# Patient Record
Sex: Female | Born: 1950 | Race: White | Hispanic: No | Smoking: Former smoker
Health system: Southern US, Community
[De-identification: ages and names within clinical notes are randomized; demographics above are authoritative.]

## PROBLEM LIST (undated history)

## (undated) DIAGNOSIS — R079 Chest pain, unspecified: Secondary | ICD-10-CM

## (undated) DIAGNOSIS — K2270 Barrett's esophagus without dysplasia: Secondary | ICD-10-CM

## (undated) DIAGNOSIS — K219 Gastro-esophageal reflux disease without esophagitis: Secondary | ICD-10-CM

## (undated) DIAGNOSIS — F640 Transsexualism: Secondary | ICD-10-CM

## (undated) HISTORY — PX: CARDIAC CATHETERIZATION: SHX172

## (undated) HISTORY — PX: COLONOSCOPY: SHX174

## (undated) HISTORY — DX: Transsexualism: F64.0

## (undated) HISTORY — PX: ORCHIECTOMY: SHX2116

## (undated) HISTORY — DX: Chest pain, unspecified: R07.9

## (undated) HISTORY — PX: ESOPHAGOGASTRODUODENOSCOPY: SHX1529

---

## 2007-12-27 ENCOUNTER — Emergency Department: Payer: Self-pay | Admitting: Emergency Medicine

## 2008-10-27 ENCOUNTER — Ambulatory Visit: Payer: Self-pay | Admitting: Cardiovascular Disease

## 2008-10-29 ENCOUNTER — Ambulatory Visit: Payer: Self-pay | Admitting: Cardiology

## 2008-10-29 ENCOUNTER — Ambulatory Visit: Payer: Self-pay | Admitting: Cardiovascular Disease

## 2008-11-09 ENCOUNTER — Ambulatory Visit: Payer: Self-pay | Admitting: Internal Medicine

## 2008-11-09 ENCOUNTER — Encounter: Payer: Self-pay | Admitting: Cardiovascular Disease

## 2008-11-09 LAB — CONVERTED CEMR LAB
CO2: 20 meq/L (ref 19–32)
Chloride: 102 meq/L (ref 96–112)
Creatinine, Ser: 0.7 mg/dL (ref 0.40–1.20)
Hemoglobin: 13.1 g/dL (ref 12.0–15.0)
INR: 1 (ref 0.0–1.5)
MCHC: 32.8 g/dL (ref 30.0–36.0)
RBC: 4.09 M/uL (ref 3.87–5.11)

## 2008-11-12 ENCOUNTER — Ambulatory Visit: Payer: Self-pay | Admitting: Cardiovascular Disease

## 2008-11-12 ENCOUNTER — Inpatient Hospital Stay (HOSPITAL_BASED_OUTPATIENT_CLINIC_OR_DEPARTMENT_OTHER): Admission: RE | Admit: 2008-11-12 | Discharge: 2008-11-12 | Payer: Self-pay | Admitting: Cardiovascular Disease

## 2009-04-01 DIAGNOSIS — R079 Chest pain, unspecified: Secondary | ICD-10-CM

## 2009-08-24 ENCOUNTER — Ambulatory Visit: Payer: Self-pay | Admitting: Gastroenterology

## 2010-11-21 ENCOUNTER — Ambulatory Visit: Payer: Self-pay | Admitting: Gastroenterology

## 2011-03-07 NOTE — Cardiovascular Report (Signed)
NAMEAUTUMN, [name]                 ACCOUNT NO.:  [id number]   MEDICAL RECORD NO.:  [id number]          PATIENT TYPE:  OIB   LOCATION:  1963                         FACILITY:  [facility]   PHYSICIAN:  Veverly Fells. Excell Seltzer, MD  DATE OF BIRTH:  1951/05/04   DATE OF PROCEDURE:  11/12/2008  DATE OF DISCHARGE:  11/12/2008                            CARDIAC CATHETERIZATION   PROCEDURE:  Left heart catheterization, selective coronary angiography,  and left ventricular angiography.   INDICATIONS:  [name] is a 60 year old woman with exertional chest  pain and highly suggestive symptoms of angina.  She underwent an  exercise Myoview stress scan with normal perfusion imaging.  However,  she had chest pain with exercise as well as significant ST-segment  depression.  In the setting of suggestive symptoms, she was referred for  cardiac catheterization.   Risks and indications of the procedure were reviewed with the patient.  Informed consent was obtained.  A right radial approach was used for  access.  The right wrist was prepped and draped under normal sterile  conditions.  Lidocaine 1% was used for local anesthetic.  Using a  micropuncture kit, the right radial artery was accessed with a front  wall puncture and then a 5-French sheath was placed in the radial  artery.  Verapamil, nitroglycerin, and heparin were infused into the  radial artery at the time of access.  The 5-French catheters were used  for coronary angiography and left ventriculography.  All catheter  exchanges were performed over a guidewire.  Intra-arterial nitroglycerin  was given for catheter exchanges to prevent vasospasm.  The patient  tolerated the procedure well, and there were no immediate complications.   FINDINGS:  Aortic pressure 109/62 with a mean of 81.  Left ventricular  pressure 109/16.   Left ventricular angiography demonstrates normal LV function with no  wall motion abnormalities and an LVEF of 60%.   CORONARY  ANGIOGRAPHY:  Left mainstem:  The left main is widely patent.  It bifurcates into the LAD and left circumflex.   LAD:  The LAD is a large-caliber vessel that courses down and reaches  the LV apex.  There is minor diffuse plaque throughout the proximal  vessel creating 20-30% stenosis, most significantly at the origin of the  first diagonal branch.  The first diagonal branch is moderate-sized  vessel with a 50% ostial stenosis.  The remaining portions of the mid  LAD have no significant obstructive disease.  There is a mild bridging  segment in the midportion of the LAD.   Left circumflex:  The left circumflex has minor ostial stenosis in the  range of 30%.  It supplies a large first OM branch and has no  significant stenosis.  The AV groove circumflex courses down and has  minor nonobstructive plaque at the bifurcation just beyond the first OM.   Right coronary artery.  The right coronary artery is dominant.  It is  smooth throughout its course with no significant luminal irregularity or  obstructive disease.  It supplies a small PDA branch and a large right  posterolateral branch.  FINAL CONCLUSIONS:  1. Mild nonobstructive coronary artery disease.  2. Normal left ventricular systolic function with an left ventricular      ejection fraction of 60%.   PLAN:  Reassurance.  Suspect noncardiac chest pain.  Medical therapy for  nonobstructive CAD.      Veverly Fells. Excell Seltzer, MD  Electronically Signed     MDC/MEDQ  D:  11/12/2008  T:  11/13/2008  Job:  [id number]

## 2011-03-07 NOTE — Assessment & Plan Note (Signed)
[facility] OFFICE NOTE   Kathleen, Donaldson                          MRN:          [id number]  DATE:10/27/2008                            DOB:          01-27-1951    REASON FOR VISIT:  Chest pain.   HISTORY OF PRESENT ILLNESS:  [name] is a 60 year old woman who  presents for evaluation of chest pain.  The patient describes episodes  of tightness in the left chest when lying on the left side.  This occurs  at rest and has been going on for several months.  However, she also  describes exertional chest discomfort that occurred while shoveling snow  in the past few weeks.  The pain felt sharp and was located in the  center of the chest.  It was nonradiating.  Her symptoms resolve with  rest.  There was no associated dyspnea, diaphoresis, nausea, or  vomiting.  She recalls having similar symptoms with vigorous work such  as snow shoveling in the past, but has never presented for evaluation.  She has no history of cardiac problems.  She also complains of shortness  of breath with moderate level exertion and ankle swelling.  The patient  denies dizziness, lightheadedness, palpitations, or syncope.   PAST MEDICAL HISTORY:  1. The patient is transsexual.  She has been taking Premarin and      spironolactone and is planning on having surgery at some point in      the future.  2. No history of hypertension, diabetes, or dyslipidemia.   PAST SURGICAL HISTORY:  Bone grafting of the left hip to the navicular  bone of the left wrist in 1978.   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  She has had a lot of stress at  work recently.  She is divorced, with 1 child, and retired from [employer].  Positive smoker with heavy smoking history of 76-pack years,  quit smoking in February 2008.  Drinks 2 alcoholic beverages daily.  No  routine exercise.   FAMILY HISTORY:  Mother died at age 74 of pneumonia.  Father died at age  74 of myocardial infarction.  Four siblings are alive and well.   MEDICATIONS:  1. Spironolactone 25 mg 2 twice daily.  2. Premarin 1.25 mg 4 daily.  3. Aspirin 81 mg daily.  4. Multivitamin 1 daily.  5. Omega-3 1000 mg daily.  6. Calcium every other day.   ALLERGIES:  NKDA.   REVIEW OF SYSTEMS:  Complete 12-point review of systems was performed.  Pertinent positives included osteoarthritis, anxiety, and depression.  Otherwise, per HPI.  All other systems were reviewed and are negative  except as outlined.   PHYSICAL EXAMINATION:  GENERAL:  The patient is alert and oriented, in  no acute distress.  VITAL SIGNS:  Weight is 198 pounds, blood pressure 140/78, heart rate  65, and respiratory rate is 12.  HEENT:  Normal.  NECK:  Normal carotid upstrokes, no bruits.  JVP normal.  No thyromegaly  or thyroid nodules.  LUNGS:  Clear bilaterally.  HEART:  Regular rate and rhythm.  No murmurs or gallops.  ABDOMEN:  Soft, nontender, no organomegaly.  BACK:  No CVA or tenderness.  EXTREMITIES:  No clubbing or cyanosis.  There is 1+ pretibial edema  bilaterally.  Pedal pulses are 2+ and equal.  SKIN:  Warm and dry without rash.   EKG shows normal sinus rhythm and is within normal limits.   ASSESSMENT:  This is a 60 year old woman with exertional chest pain.  There is a paucity of cardiovascular risk factors.  However, the  patient's symptoms are concerning as they are exertional in nature.  She  has both typical and atypical features of chest pain.   PLAN:  An exercise Myoview stress scan to rule out significant ischemia.  If her stress study is normal or at low risk, I would not pursue  invasive workup.  Further plans pending the result of the patient's  stress test.     [name]. Excell Seltzer, MD  Electronically Signed    MDC/MedQ  DD: 10/27/2008  DT: 10/28/2008  Job #: [id number]   cc:   Zachery Dakins, MD

## 2011-04-10 ENCOUNTER — Encounter: Payer: Self-pay | Admitting: Cardiovascular Disease

## 2012-12-24 ENCOUNTER — Ambulatory Visit: Payer: Self-pay | Admitting: Gastroenterology

## 2012-12-25 LAB — PATHOLOGY REPORT

## 2015-02-09 ENCOUNTER — Ambulatory Visit
Admit: 2015-02-09 | Disposition: A | Discharge status: Home / Self Care | Payer: Self-pay | Attending: Gastroenterology | Admitting: Gastroenterology

## 2015-02-15 LAB — SURGICAL PATHOLOGY

## 2017-09-26 ENCOUNTER — Ambulatory Visit (INDEPENDENT_AMBULATORY_CARE_PROVIDER_SITE_OTHER): Payer: Medicare Other | Admitting: Podiatry

## 2017-09-26 ENCOUNTER — Encounter: Payer: Self-pay | Admitting: Podiatry

## 2017-09-26 VITALS — BP 143/73 | HR 53 | Temp 97.2°F | Resp 18

## 2017-09-26 DIAGNOSIS — L603 Nail dystrophy: Secondary | ICD-10-CM

## 2017-09-26 MED ORDER — NEOMYCIN-POLYMYXIN-HC 3.5-10000-1 OT SOLN: OTIC | 0 refills | Status: DC

## 2017-09-26 NOTE — Progress Notes (Signed)
  Subjective:  Patient ID: [name], female    DOB: 1951-02-26,  MRN: [id number] HPI Chief Complaint  Patient presents with  . Nail Problem    Patient presents today for nail fungus bilat great toenails and 5th toenails x years.  She states they only hurt when they grow thick.  she has used OTC fungal liquids with slight improvement.    66 y.o. female presents with the above complaint.     Past Medical History:  Diagnosis Date  . Chest pain, unspecified   . Transexualism      Current Outpatient Medications:  .  estradiol (VIVELLE-DOT) 0.075 MG/24HR, Place onto the skin., Disp: , Rfl:  .  omeprazole (PRILOSEC) 40 MG capsule, Take by mouth., Disp: , Rfl:  .  acetaminophen (TYLENOL) 500 MG tablet, Take by mouth., Disp: , Rfl:  .  Melatonin 5 MG TABS, Take by mouth., Disp: , Rfl:  .  Multiple Vitamins-Minerals (MULTIVITAMIN WOMEN) TABS, Take by mouth., Disp: , Rfl:   No Known Allergies Review of Systems  All other systems reviewed and are negative.  Objective:   Vitals:   09/26/17 0925  BP: (!) 143/73  Pulse: (!) 53  Resp: 18  Temp: (!) 97.2 F (36.2 C)    General: Well developed, nourished, in no acute distress, alert and oriented x3   Dermatological: Skin is warm, dry and supple bilateral. Nails x 10 are well maintained; remaining integument appears unremarkable at this time. There are no open sores, no preulcerative lesions, no rash or signs of infection present.  There appears to be no signs of fungus on the skin.  Lesser nails particularly #2 #3 #4 the bilateral foot demonstrates relatively normal nail structure.  The hallux nails bilaterally do demonstrate what appears to be a nail dystrophy with thickening of the nails subungual debris.  Fifth toes similarly.  No signs of bacterial infection.  Vascular: Dorsalis Pedis artery and Posterior Tibial artery pedal pulses are 2/4 bilateral with immedate capillary fill time. Pedal hair growth present. No varicosities and no  lower extremity edema present bilateral.   Neruologic: Grossly intact via light touch bilateral. Vibratory intact via tuning fork bilateral. Protective threshold with Semmes Wienstein monofilament intact to all pedal sites bilateral. Patellar and Achilles deep tendon reflexes 2+ bilateral. No Babinski or clonus noted bilateral.   Musculoskeletal: No gross boney pedal deformities bilateral. No pain, crepitus, or limitation noted with foot and ankle range of motion bilateral. Muscular strength 5/5 in all groups tested bilateral.  Gait: Unassisted, Nonantalgic.    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  More than likely this will be nail dystrophy rather than any type of fungus or yeast or mold.     Launa Goedken T. RyeHyatt, [place]

## 2017-10-29 ENCOUNTER — Encounter: Payer: Self-pay | Admitting: Podiatry

## 2017-10-29 ENCOUNTER — Ambulatory Visit (INDEPENDENT_AMBULATORY_CARE_PROVIDER_SITE_OTHER): Payer: Medicare Other | Admitting: Podiatry

## 2017-10-29 DIAGNOSIS — L603 Nail dystrophy: Secondary | ICD-10-CM

## 2017-10-29 DIAGNOSIS — Z79899 Other long term (current) drug therapy: Secondary | ICD-10-CM | POA: Diagnosis not present

## 2017-10-29 MED ORDER — TERBINAFINE HCL 250 MG PO TABS: 250.0000 mg | ORAL_TABLET | Freq: Every day | ORAL | 0 refills | Status: DC

## 2017-10-29 NOTE — Progress Notes (Signed)
She presents today for follow-up of her pathology results regarding her toenail.  Objective: Vital signs are stable alert and oriented x3.  This point there is no change in physical exam only a positive onychomycosis from the lab.  Assessment: Onychomycosis.  Plan: After thorough discussion today we have decided to utilize oral Lamisil therapy.  We did discuss the pros and cons of this oral therapy today.  Wrote her prescription for 250 mg tablets 1 tablet by mouth daily.  Total of 30 tablets were dispensed with no refills.  Also wrote a prescription for liver profile requisition to be performed immediately.  I will follow-up with her in 1 month.  We did discuss the possible side effects.

## 2017-10-29 NOTE — Patient Instructions (Signed)

## 2017-10-30 ENCOUNTER — Telehealth: Payer: Self-pay | Admitting: *Deleted

## 2017-10-30 LAB — HEPATIC FUNCTION PANEL
ALK PHOS: 93 IU/L (ref 39–117)
ALT: 12 IU/L (ref 0–32)
AST: 16 IU/L (ref 0–40)
Albumin: 4.4 g/dL (ref 3.6–4.8)
BILIRUBIN, DIRECT: 0.09 mg/dL (ref 0.00–0.40)
Bilirubin Total: 0.3 mg/dL (ref 0.0–1.2)
TOTAL PROTEIN: 7.3 g/dL (ref 6.0–8.5)

## 2017-10-30 NOTE — Telephone Encounter (Signed)
-----   Message from Elinor ParkinsonMax T Hyatt, [place] sent at 10/30/2017  8:04 AM EST ----- Blood work looks good and may continue medication.

## 2017-10-30 NOTE — Telephone Encounter (Signed)
Left message informing pt of Dr. Hyatt's review of results and orders. 

## 2017-11-16 ENCOUNTER — Encounter: Payer: Self-pay | Admitting: *Deleted

## 2017-11-19 ENCOUNTER — Encounter: Payer: Self-pay | Admitting: *Deleted

## 2017-11-19 ENCOUNTER — Encounter
Admission: RE | Disposition: A | Discharge status: Home / Self Care | Payer: Self-pay | Source: Ambulatory Visit | Attending: Gastroenterology

## 2017-11-19 ENCOUNTER — Ambulatory Visit: Payer: Medicare Other | Admitting: Anesthesiology

## 2017-11-19 ENCOUNTER — Ambulatory Visit
Admission: RE | Admit: 2017-11-19 | Discharge: 2017-11-19 | Disposition: A | Discharge status: Home / Self Care | Payer: Medicare Other | Source: Ambulatory Visit | Attending: Gastroenterology | Admitting: Gastroenterology

## 2017-11-19 DIAGNOSIS — F640 Transsexualism: Secondary | ICD-10-CM | POA: Insufficient documentation

## 2017-11-19 DIAGNOSIS — K2270 Barrett's esophagus without dysplasia: Secondary | ICD-10-CM | POA: Insufficient documentation

## 2017-11-19 DIAGNOSIS — K449 Diaphragmatic hernia without obstruction or gangrene: Secondary | ICD-10-CM | POA: Insufficient documentation

## 2017-11-19 DIAGNOSIS — Z79899 Other long term (current) drug therapy: Secondary | ICD-10-CM | POA: Insufficient documentation

## 2017-11-19 DIAGNOSIS — K210 Gastro-esophageal reflux disease with esophagitis: Secondary | ICD-10-CM | POA: Diagnosis not present

## 2017-11-19 DIAGNOSIS — K2950 Unspecified chronic gastritis without bleeding: Secondary | ICD-10-CM | POA: Insufficient documentation

## 2017-11-19 DIAGNOSIS — Z87891 Personal history of nicotine dependence: Secondary | ICD-10-CM | POA: Diagnosis not present

## 2017-11-19 HISTORY — DX: Barrett's esophagus without dysplasia: K22.70

## 2017-11-19 HISTORY — PX: ESOPHAGOGASTRODUODENOSCOPY (EGD) WITH PROPOFOL: SHX5813

## 2017-11-19 HISTORY — DX: Gastro-esophageal reflux disease without esophagitis: K21.9

## 2017-11-19 SURGERY — ESOPHAGOGASTRODUODENOSCOPY (EGD) WITH PROPOFOL
Anesthesia: General

## 2017-11-19 MED ORDER — SODIUM CHLORIDE 0.9 % IV SOLN
INTRAVENOUS | Status: DC
  Administered 2017-11-19: 13:00:00 via INTRAVENOUS

## 2017-11-19 MED ORDER — MIDAZOLAM HCL 2 MG/2ML IJ SOLN
INTRAMUSCULAR | Status: DC | PRN
  Administered 2017-11-19: 1 mg via INTRAVENOUS

## 2017-11-19 MED ORDER — MIDAZOLAM HCL 2 MG/2ML IJ SOLN
INTRAMUSCULAR | Status: AC
  Filled 2017-11-19: qty 2

## 2017-11-19 MED ORDER — PROPOFOL 500 MG/50ML IV EMUL
INTRAVENOUS | Status: DC | PRN
  Administered 2017-11-19: 140 ug/kg/min via INTRAVENOUS

## 2017-11-19 MED ORDER — SODIUM CHLORIDE 0.9 % IV SOLN: INTRAVENOUS | Status: DC

## 2017-11-19 MED ORDER — PROPOFOL 10 MG/ML IV BOLUS
INTRAVENOUS | Status: DC | PRN
  Administered 2017-11-19: 50 mg via INTRAVENOUS

## 2017-11-19 MED ORDER — GLYCOPYRROLATE 0.2 MG/ML IJ SOLN
INTRAMUSCULAR | Status: DC | PRN
  Administered 2017-11-19: 0.2 mg via INTRAVENOUS

## 2017-11-19 MED ORDER — FENTANYL CITRATE (PF) 100 MCG/2ML IJ SOLN
INTRAMUSCULAR | Status: AC
  Filled 2017-11-19: qty 2

## 2017-11-19 MED ORDER — PROPOFOL 500 MG/50ML IV EMUL
INTRAVENOUS | Status: AC
  Filled 2017-11-19: qty 50

## 2017-11-19 MED ORDER — FENTANYL CITRATE (PF) 100 MCG/2ML IJ SOLN
INTRAMUSCULAR | Status: DC | PRN
  Administered 2017-11-19: 50 ug via INTRAVENOUS

## 2017-11-19 NOTE — Op Note (Signed)
[facility] Gastroenterology Patient Name: Kathleen QuailLisa Donaldson Procedure Date: 11/19/2017 2:33 PM MRN: [id number] Account #: [id number] Date of Birth: 01-Mar-1951 Admit Type: Outpatient Age: [age] [facility] Gender: Female Note Status: Finalized Procedure:            Upper GI endoscopy Indications:          Follow-up of Barrett's esophagus Providers:            Christena DeemMartin U. Roque Schill, MD Referring MD:         Rhona LeavensJames F. Burnett ShengHedrick, MD (Referring MD) Medicines:            Monitored Anesthesia Care Complications:        No immediate complications. Procedure:            Pre-Anesthesia Assessment:                       - ASA Grade Assessment: III - A patient with severe                        systemic disease.                       After obtaining informed consent, the endoscope was                        passed under direct vision. Throughout the procedure,                        the patient's blood pressure, pulse, and oxygen                        saturations were monitored continuously. The Endoscope                        was introduced through the mouth, and advanced to the                        third part of duodenum. The upper GI endoscopy was                        accomplished without difficulty. The patient tolerated                        the procedure well. Findings:      There were esophageal mucosal changes secondary to established       short-segment Barrett's disease present in the distal esophagus. The       maximum longitudinal extent of these mucosal changes was 2 cm in length.       Mucosa was biopsied with a cold forceps for histology in 4 quadrants at       intervals of 2 cm at 39, 40 and 42 cm from the incisors. A total of 3       specimen bottles were sent to pathology.      A small hiatal hernia was found. The Z-line was a variable distance from       incisors; the hiatal hernia was sliding.      Diffuse mild inflammation characterized by  congestion (edema) and       erythema was found in the gastric body. Biopsies were taken with a cold  forceps for histology. Biopsies were taken with a cold forceps for       Helicobacter pylori testing.      The examined duodenum was normal. Impression:           - Esophageal mucosal changes secondary to established                        short-segment Barrett's disease. Biopsied.                       - Small hiatal hernia.                       - gastritis Recommendation:       - Await pathology results.                       - Continue present medications.                       - Telephone GI clinic for pathology results in 1 week. Procedure Code(s):    --- Professional ---                       [phone], Esophagogastroduodenoscopy, flexible, transoral;                        with biopsy, single or multiple Diagnosis Code(s):    --- Professional ---                       K22.70, Barrett's esophagus without dysplasia                       K44.9, Diaphragmatic hernia without obstruction or                        gangrene CPT copyright 2016 American Medical Association. All rights reserved. The codes documented in this report are preliminary and upon coder review may  be revised to meet current compliance requirements. Christena Deem, MD 11/19/2017 3:00:31 PM This report has been signed electronically. Number of Addenda: 0 Note Initiated On: 11/19/2017 2:33 PM      [facility]

## 2017-11-19 NOTE — Anesthesia Procedure Notes (Signed)
Date/Time: 11/19/2017 2:35 PM Performed by: Henrietta HooverPope, Noreen Mackintosh, CRNA

## 2017-11-19 NOTE — H&P (Signed)
Outpatient short stay form Pre-procedure 11/19/2017 2:26 PM [name] [name] [name]  Primary Physician: Dr. Jerl MinaJames Donaldson  Reason for visit:  EGD  History of present illness:  Patient is a 67 year old female presenting today as above. She has personal history of irritable esophagus and is presenting today for follow-up. She has no problem with abdominal pain heartburn or difficulty swallowing. She takes an 81 mg aspirin that has been held. She takes no other aspirin or blood thinning agent.    Current Facility-Administered Medications:  .  0.9 %  sodium chloride infusion, , Intravenous, Continuous, [name], [name], [name], Last Rate: 20 mL/hr at 11/19/17 1307 .  0.9 %  sodium chloride infusion, , Intravenous, Continuous, [name], [name], [name]  Medications Prior to Admission  Medication Sig Dispense Refill Last Dose  . acetaminophen (TYLENOL) 500 MG tablet Take by mouth.   11/18/2017 at Unknown time  . estradiol (VIVELLE-DOT) 0.075 MG/24HR Place onto the skin.   11/19/2017 at Unknown time  . Melatonin 5 MG TABS Take by mouth.   Past Week at Unknown time  . Multiple Vitamins-Minerals (MULTIVITAMIN WOMEN) TABS Take by mouth.   Past Week at Unknown time  . omeprazole (PRILOSEC) 40 MG capsule Take by mouth.   11/18/2017 at Unknown time  . terbinafine (LAMISIL) 250 MG tablet Take 1 tablet (250 mg total) by mouth daily. 30 tablet 0 Past Week at Unknown time     No Known Allergies   Past Medical History:  Diagnosis Date  . Barrett's esophagus   . Chest pain, unspecified   . GERD (gastroesophageal reflux disease)   . Transexualism     Review of systems:      Physical Exam    Heart and lungs: Regular rate and rhythm without rub or gallop, lungs are bilaterally clear.    HEENT: Normocephalic atraumatic eyes are anicteric    Other:     Pertinant exam for procedure: Soft nontender nondistended bowel sounds positive normoactive.    Planned proceedures: EGD and indicated procedures. I  have discussed the risks benefits and complications of procedures to include not limited to bleeding, infection, perforation and the risk of sedation and the patient wishes to proceed.    [name], [name] [facility] 11/19/2017  2:26 PM

## 2017-11-19 NOTE — Transfer of Care (Signed)
Immediate Anesthesia Transfer of Care Note  Patient: [name]  Procedure(s) Performed: ESOPHAGOGASTRODUODENOSCOPY (EGD) WITH PROPOFOL (N/A )  Patient Location: PACU  Anesthesia Type:General  Level of Consciousness: sedated  Airway & Oxygen Therapy: Patient Spontanous Breathing and Patient connected to nasal cannula oxygen  Post-op Assessment: Report given to RN and Post -op Vital signs reviewed and stable  Post vital signs: Reviewed and stable  Last Vitals:  Vitals:   11/19/17 1250  BP: (!) 130/58  Pulse: 63  Resp: 16  Temp: (!) 35.8 C  SpO2: 100%    Last Pain:  Vitals:   11/19/17 1250  TempSrc: Tympanic         Complications: No apparent anesthesia complications

## 2017-11-19 NOTE — Anesthesia Postprocedure Evaluation (Signed)
Anesthesia Post Note  Patient: [name]  Procedure(s) Performed: ESOPHAGOGASTRODUODENOSCOPY (EGD) WITH PROPOFOL (N/A )  Patient location during evaluation: PACU Anesthesia Type: General Level of consciousness: awake and alert and oriented Pain management: pain level controlled Vital Signs Assessment: post-procedure vital signs reviewed and stable Respiratory status: spontaneous breathing Cardiovascular status: blood pressure returned to baseline Anesthetic complications: no     Last Vitals:  Vitals:   11/19/17 1500 11/19/17 1510  BP: 109/67 106/71  Pulse: 62 (!) 54  Resp: 16 18  Temp: (!) 35.6 C   SpO2: 94% 99%    Last Pain:  Vitals:   11/19/17 1500  TempSrc: Tympanic                 Sylvan Sookdeo

## 2017-11-19 NOTE — Anesthesia Preprocedure Evaluation (Addendum)
Anesthesia Evaluation  Patient identified by MRN, date of birth, ID band Patient awake    Reviewed: Allergy & Precautions, NPO status , Patient's Chart, lab work & pertinent test results, reviewed documented beta blocker date and time   Airway Mallampati: II  TM Distance: >3 FB     Dental  (+) Chipped, Upper Dentures, Lower Dentures   Pulmonary former smoker,           Cardiovascular      Neuro/Psych    GI/Hepatic GERD  ,  Endo/Other    Renal/GU      Musculoskeletal   Abdominal   Peds  Hematology   Anesthesia Other Findings   Reproductive/Obstetrics                            Anesthesia Physical Anesthesia Plan  ASA: III  Anesthesia Plan: General   Post-op Pain Management:    Induction: Intravenous  PONV Risk Score and Plan:   Airway Management Planned:   Additional Equipment:   Intra-op Plan:   Post-operative Plan:   Informed Consent: I have reviewed the patients History and Physical, chart, labs and discussed the procedure including the risks, benefits and alternatives for the proposed anesthesia with the patient or authorized representative who has indicated his/her understanding and acceptance.     Plan Discussed with: CRNA  Anesthesia Plan Comments:         Anesthesia Quick Evaluation

## 2017-11-19 NOTE — Anesthesia Post-op Follow-up Note (Signed)
Anesthesia QCDR form completed.        

## 2017-11-20 ENCOUNTER — Encounter: Payer: Self-pay | Admitting: Gastroenterology

## 2017-11-22 LAB — SURGICAL PATHOLOGY

## 2017-11-26 ENCOUNTER — Ambulatory Visit (INDEPENDENT_AMBULATORY_CARE_PROVIDER_SITE_OTHER): Payer: Medicare Other | Admitting: Podiatry

## 2017-11-26 DIAGNOSIS — Z79899 Other long term (current) drug therapy: Secondary | ICD-10-CM | POA: Diagnosis not present

## 2017-11-26 MED ORDER — TERBINAFINE HCL 250 MG PO TABS: 250.0000 mg | ORAL_TABLET | Freq: Every day | ORAL | 0 refills | Status: DC

## 2017-11-26 NOTE — Progress Notes (Signed)
She presents today for follow-up for her 4-week follow-up after taking Lamisil.  She states that she is doing well taking the Lamisil with no complications and no side effects.  She denies fever chills nausea vomiting muscle aches and pains.  Objective: Vital signs are stable alert and oriented x3.  Pulses are palpable.  No change in the nail plates as of yet.  Assessment onychomycosis.  Long-term treatment with Lamisil.  Plan: Dispensed another liver profile requisition.  Should this come back abnormal we will notify her.  Otherwise I will follow-up with her in 4 months after she has completed her next 90 days of Lamisil.

## 2017-11-27 ENCOUNTER — Telehealth: Payer: Self-pay | Admitting: *Deleted

## 2017-11-27 LAB — HEPATIC FUNCTION PANEL
ALBUMIN: 4.1 g/dL (ref 3.6–4.8)
ALK PHOS: 81 IU/L (ref 39–117)
ALT: 13 IU/L (ref 0–32)
AST: 17 IU/L (ref 0–40)
BILIRUBIN, DIRECT: 0.08 mg/dL (ref 0.00–0.40)
Bilirubin Total: 0.2 mg/dL (ref 0.0–1.2)
TOTAL PROTEIN: 6.6 g/dL (ref 6.0–8.5)

## 2017-11-27 NOTE — Telephone Encounter (Signed)
I informed pt of Dr. Hyatt's review of results and orders. 

## 2017-11-27 NOTE — Telephone Encounter (Signed)
-----   Message from Elinor ParkinsonMax T Hyatt, [place] sent at 11/27/2017  8:42 AM EST ----- Blood work looks good and may continue medication.

## 2018-03-12 ENCOUNTER — Telehealth: Payer: Self-pay | Admitting: Podiatry

## 2018-03-12 NOTE — Telephone Encounter (Signed)
Pt needs a refill for Lamil

## 2018-03-13 NOTE — Telephone Encounter (Signed)
I returned patient call and left voice mail stating that Per Dr. Al Corpus, she cannot get another refill of Lamisil until she comes for her follow up appt 03/29/18.

## 2018-03-27 ENCOUNTER — Encounter: Payer: Self-pay | Admitting: Podiatry

## 2018-03-27 ENCOUNTER — Ambulatory Visit (INDEPENDENT_AMBULATORY_CARE_PROVIDER_SITE_OTHER): Payer: Medicare Other | Admitting: Podiatry

## 2018-03-27 DIAGNOSIS — Z79899 Other long term (current) drug therapy: Secondary | ICD-10-CM

## 2018-03-27 MED ORDER — TERBINAFINE HCL 250 MG PO TABS: 250.0000 mg | ORAL_TABLET | Freq: Every day | ORAL | 0 refills | Status: DC

## 2018-03-27 NOTE — Progress Notes (Signed)
She presents today for follow-up of nail fungus states that have noticed just a little improvement but not much she states that she had no problems taking medication.  Objective: Vital signs stable she is alert oriented x3 hallux nails really have not grown very much there is nail dystrophy associated with it.  Assessment: Nail dystrophy onychomycosis hallux.  Plan: Patient agreed to go ahead and grow out eventually so I am starting her back on Lamisil every other day.

## 2018-07-03 ENCOUNTER — Ambulatory Visit (INDEPENDENT_AMBULATORY_CARE_PROVIDER_SITE_OTHER): Payer: Medicare Other | Admitting: Podiatry

## 2018-07-03 ENCOUNTER — Encounter: Payer: Self-pay | Admitting: Podiatry

## 2018-07-03 DIAGNOSIS — L603 Nail dystrophy: Secondary | ICD-10-CM | POA: Diagnosis not present

## 2018-07-03 MED ORDER — TERBINAFINE HCL 250 MG PO TABS: 250.0000 mg | ORAL_TABLET | Freq: Every day | ORAL | 0 refills | Status: DC

## 2018-07-03 NOTE — Progress Notes (Signed)
Presents today for follow-up of fungus completed 120 days of medicine plus every other day dosing.  She states that they are looking much better but they are not completed yet she would like to continue with the medication until they are completely resolved.  She denies any fever chills nausea vomiting muscle aches pains any concerns regarding the medication whatsoever.  Objective: Vital signs are stable alert and oriented x3 pulses are palpable.  Toenails are nearly 100% grown out of the hallux nails appear to be a little bit thicker and slower to grow.  Assessment: Long-term therapy with Lamisil.  Plan: Nearly 100% resolved so we will continue her on an every other day medication Lamisil 250 mg tablets 1 every other day by mouth for the next 2 months and I will follow-up with her in 3 months.

## 2018-10-02 ENCOUNTER — Encounter: Payer: Self-pay | Admitting: Podiatry

## 2018-10-02 ENCOUNTER — Ambulatory Visit (INDEPENDENT_AMBULATORY_CARE_PROVIDER_SITE_OTHER): Payer: Medicare Other | Admitting: Podiatry

## 2018-10-02 DIAGNOSIS — L603 Nail dystrophy: Secondary | ICD-10-CM

## 2018-10-02 MED ORDER — TERBINAFINE HCL 250 MG PO TABS: 250.0000 mg | ORAL_TABLET | Freq: Every day | ORAL | 0 refills | Status: DC

## 2018-10-02 NOTE — Progress Notes (Signed)
She presents today for follow-up of her nail fungus she is completed 120 days +1 round of every other day dosing and she is looking much better she states.  Objective: Vital signs are stable alert oriented x3 nearly 100% grown out at this time that there is a small rim of onychomycosis present.  Assessment: Long-term therapy of Lamisil with onychomycosis.  Plan: Started her on an every other day dose of Lamisil 30 tablets were dispensed follow-up with her in 3 months

## 2019-01-01 ENCOUNTER — Encounter: Payer: Self-pay | Admitting: Podiatry

## 2019-01-01 ENCOUNTER — Ambulatory Visit (INDEPENDENT_AMBULATORY_CARE_PROVIDER_SITE_OTHER): Payer: Medicare Other | Admitting: Podiatry

## 2019-01-01 ENCOUNTER — Other Ambulatory Visit: Payer: Self-pay

## 2019-01-01 DIAGNOSIS — L603 Nail dystrophy: Secondary | ICD-10-CM

## 2019-01-01 NOTE — Progress Notes (Signed)
She presents today for follow-up of nail fungus.  States that she is completed her third round of every other day therapy.  States the toes are looking so much better she is very happy with the outcome.  Denies any changes in her past medical history medications allergies denies any rashes or itching.  Objective: Vital signs are stable she is alert oriented x3 toenails are completely grown out 100%.  Assessment: Well-healing onychomycosis secondary to long-term therapy with Lamisil.  Plan: Explained to her that she needs to keep the nails cut short and follow thin she will follow-up with [place] with questions or concerns or if she is concerned that there is any regrowth.

## 2021-08-12 ENCOUNTER — Ambulatory Visit: Payer: Medicare Other

## 2022-02-17 ENCOUNTER — Other Ambulatory Visit: Payer: Self-pay | Admitting: *Deleted

## 2022-02-17 DIAGNOSIS — Z87891 Personal history of nicotine dependence: Secondary | ICD-10-CM

## 2022-02-17 DIAGNOSIS — Z122 Encounter for screening for malignant neoplasm of respiratory organs: Secondary | ICD-10-CM

## 2022-03-01 ENCOUNTER — Ambulatory Visit (INDEPENDENT_AMBULATORY_CARE_PROVIDER_SITE_OTHER): Payer: Medicare Other | Admitting: Acute Care

## 2022-03-01 ENCOUNTER — Encounter: Payer: Self-pay | Admitting: Acute Care

## 2022-03-01 DIAGNOSIS — Z87891 Personal history of nicotine dependence: Secondary | ICD-10-CM | POA: Diagnosis not present

## 2022-03-01 NOTE — Progress Notes (Signed)
Virtual Visit via Telephone Note ? ?I connected with [name] on 03/01/22 at  9:30 AM EDT by telephone and verified that I am speaking with the correct person using two identifiers. ? ?Location: ?Patient: At home ?Provider: [address] [place], [place], [address]  ?  ?I discussed the limitations, risks, security and privacy concerns of performing an evaluation and management service by telephone and the availability of in person appointments. I also discussed with the patient that there may be a patient responsible charge related to this service. The patient expressed understanding and agreed to proceed. ? ? ?Shared Decision Making Visit Lung Cancer Screening Program ?([id number]) ? ? ?Eligibility: ?Age 71 y.o. ?Pack Years Smoking History Calculation 49.2 pack year smoking history ?(# packs/per year x # years smoked) ?Recent History of coughing up blood  no ?Unexplained weight loss? no ?( >Than 15 pounds within the last 6 months ) ?Prior History Lung / other cancer no ?(Diagnosis within the last 5 years already requiring surveillance chest CT Scans). ?Smoking Status Former Smoker ?Former Smokers: Years since quit: 1 year ? Quit Date: 2022 ? ?Visit Components: ?Discussion included one or more decision making aids. yes ?Discussion included risk/benefits of screening. yes ?Discussion included potential follow up diagnostic testing for abnormal scans. yes ?Discussion included meaning and risk of over diagnosis. yes ?Discussion included meaning and risk of False Positives. yes ?Discussion included meaning of total radiation exposure. yes ? ?Counseling Included: ?Importance of adherence to annual lung cancer LDCT screening. yes ?Impact of comorbidities on ability to participate in the program. yes ?Ability and willingness to under diagnostic treatment. yes ? ?Smoking Cessation Counseling: ?Current Smokers:  ?Discussed importance of smoking cessation. yes ?Information about tobacco cessation classes and  interventions provided to patient. yes ?Patient provided with "ticket" for LDCT Scan. yes ?Symptomatic Patient. no ? Counseling NA ?Diagnosis Code: Tobacco Use Z72.0 ?Asymptomatic Patient yes ? Counseling (Intermediate counseling: > three minutes counseling) C9470 ?Former Smokers:  ?Discussed the importance of maintaining cigarette abstinence. yes ?Diagnosis Code: Personal History of Nicotine Dependence. J62.836 ?Information about tobacco cessation classes and interventions provided to patient. Yes ?Patient provided with "ticket" for LDCT Scan. yes ?Written Order for Lung Cancer Screening with LDCT placed in Epic. Yes ?(CT Chest Lung Cancer Screening Low Dose W/O CM) [id number] ?Z12.2-Screening of respiratory organs ?Z87.891-Personal history of nicotine dependence ? ?I spent 25 minutes of face to face time/virtual visit time  with  [name] discussing the risks and benefits of lung cancer screening. We took the time to pause the power point at intervals to allow for questions to be asked and answered to ensure understanding. We discussed that she had taken the single most powerful action possible to decrease her risk of developing lung cancer when she quit smoking. I counseled her to remain smoke free, and to contact me if she ever had the desire to smoke again so that I can provide resources and tools to help support the effort to remain smoke free. We discussed the time and location of the scan, and that either  [name] RN, [name], RN or I  or I will call / send a letter with the results within  24-72 hours of receiving them. She has the office contact information in the event she needs to speak with me,  she verbalized understanding of all of the above and had no further questions upon leaving the office.  ? ? ? ?I explained to the patient that there has been  a high incidence of coronary artery disease noted on these exams. I explained that this is a non-gated exam therefore degree or severity cannot  be determined. This patient is on statin therapy. I have asked the patient to follow-up with their PCP regarding any incidental finding of coronary artery disease and management with diet or medication as they feel is clinically indicated. The patient verbalized understanding of the above and had no further questions. ?  ? ? ?Kathleen Ngo, NP ?03/01/2022 ? ? ? ? ? ? ?

## 2022-03-01 NOTE — Patient Instructions (Signed)
Thank you for participating in the [facility]. It was our pleasure to meet you today. We will call you with the results of your scan within the next few days. Your scan will be assigned a Lung RADS category score by the physicians reading the scans.  This Lung RADS score determines follow up scanning.  See below for description of categories, and follow up screening recommendations. We will be in touch to schedule your follow up screening annually or based on recommendations of our providers. We will fax a copy of your scan results to your Primary Care Physician, or the physician who referred you to the program, to ensure they have the results. Please call the office if you have any questions or concerns regarding your scanning experience or results.  Our office number is [phone]. Please speak with Denise Phelps, RN. , or  Denise Buckner RN, They are  our Lung Cancer Screening RN.'s If They are unavailable when you call, Please leave a message on the voice mail. We will return your call at our earliest convenience.This voice mail is monitored several times a day.  Remember, if your scan is normal, we will scan you annually as long as you continue to meet the criteria for the program. (Age 55-77, Current smoker or smoker who has quit within the last 15 years). If you are a smoker, remember, quitting is the single most powerful action that you can take to decrease your risk of lung cancer and other pulmonary, breathing related problems. We know quitting is hard, and we are here to help.  Please let us know if there is anything we can do to help you meet your goal of quitting. If you are a former smoker, congratulations. We are proud of you! Remain smoke free! Remember you can refer friends or family members through the number above.  We will screen them to make sure they meet criteria for the program. Thank you for helping us take better care of you by  participating in Lung Screening.  You can receive free nicotine replacement therapy ( patches, gum or mints) by calling 1-800-QUIT NOW. Please call so we can get you on the path to becoming  a non-smoker. I know it is hard, but you can do this!  Lung RADS Categories:  Lung RADS 1: no nodules or definitely non-concerning nodules.  Recommendation is for a repeat annual scan in 12 months.  Lung RADS 2:  nodules that are non-concerning in appearance and behavior with a very low likelihood of becoming an active cancer. Recommendation is for a repeat annual scan in 12 months.  Lung RADS 3: nodules that are probably non-concerning , includes nodules with a low likelihood of becoming an active cancer.  Recommendation is for a 6-month repeat screening scan. Often noted after an upper respiratory illness. We will be in touch to make sure you have no questions, and to schedule your 6-month scan.  Lung RADS 4 A: nodules with concerning findings, recommendation is most often for a follow up scan in 3 months or additional testing based on our provider's assessment of the scan. We will be in touch to make sure you have no questions and to schedule the recommended 3 month follow up scan.  Lung RADS 4 B:  indicates findings that are concerning. We will be in touch with you to schedule additional diagnostic testing based on our provider's  assessment of the scan.  Other options for assistance in smoking cessation (   As covered by your insurance benefits)  Hypnosis for smoking cessation  [employer] [phone]  Acupuncture for smoking cessation  [facility] [phone]   

## 2022-03-03 ENCOUNTER — Ambulatory Visit
Admission: RE | Admit: 2022-03-03 | Discharge: 2022-03-03 | Disposition: A | Discharge status: Home / Self Care | Payer: Medicare Other | Source: Ambulatory Visit | Attending: Acute Care | Admitting: Acute Care

## 2022-03-03 DIAGNOSIS — Z122 Encounter for screening for malignant neoplasm of respiratory organs: Secondary | ICD-10-CM | POA: Insufficient documentation

## 2022-03-03 DIAGNOSIS — Z87891 Personal history of nicotine dependence: Secondary | ICD-10-CM | POA: Insufficient documentation

## 2022-03-06 ENCOUNTER — Telehealth: Payer: Self-pay | Admitting: Acute Care

## 2022-03-06 NOTE — Telephone Encounter (Signed)
This is an FYI for the provider.  °

## 2022-03-08 ENCOUNTER — Telehealth: Payer: Self-pay | Admitting: Acute Care

## 2022-03-08 DIAGNOSIS — R911 Solitary pulmonary nodule: Secondary | ICD-10-CM

## 2022-03-08 DIAGNOSIS — Z87891 Personal history of nicotine dependence: Secondary | ICD-10-CM

## 2022-03-08 NOTE — Telephone Encounter (Addendum)
I have called the patient with the results of her low dose Ct Chest. I explained that her scan was read as a Lung  RADS 3, nodules that are probably benign findings, short term follow up suggested: includes nodules with a low likelihood of becoming a clinically active cancer. The scan was reviewed by Dr. Jayme Cloud and she recommends a 3 month follow up as the area is spiculated and very close to the 8 mm size that can benefit from PET scanning. ?Denise, please place order for a 3 month CT Chest without contrast , and  Fax results to PCP . Thanks so much ?

## 2022-03-08 NOTE — Telephone Encounter (Signed)
Ct results faxed to PCP with follow up plans included. Order placed for 3 mth CT Chest w/o contrast.  ?

## 2022-05-30 NOTE — Telephone Encounter (Signed)
I had to leave a message for the patient to call me back to scheduled this CT

## 2022-05-30 NOTE — Telephone Encounter (Signed)
PCC's please advise on scheduling pt's LD CT chest. Thanks.

## 2022-06-02 NOTE — Telephone Encounter (Signed)
Patient has been scheduled on 06/06/22 @ 9:20am at [facility] and she is aware

## 2022-06-06 ENCOUNTER — Other Ambulatory Visit: Payer: Self-pay | Admitting: Acute Care

## 2022-06-06 ENCOUNTER — Ambulatory Visit
Admission: RE | Admit: 2022-06-06 | Discharge: 2022-06-06 | Disposition: A | Discharge status: Home / Self Care | Payer: Medicare Other | Source: Ambulatory Visit | Attending: Acute Care | Admitting: Acute Care

## 2022-06-06 DIAGNOSIS — Z87891 Personal history of nicotine dependence: Secondary | ICD-10-CM | POA: Insufficient documentation

## 2022-06-06 DIAGNOSIS — R911 Solitary pulmonary nodule: Secondary | ICD-10-CM | POA: Diagnosis present

## 2022-06-09 ENCOUNTER — Other Ambulatory Visit: Payer: Self-pay | Admitting: Acute Care

## 2022-06-09 ENCOUNTER — Telehealth: Payer: Self-pay | Admitting: Acute Care

## 2022-06-09 ENCOUNTER — Other Ambulatory Visit: Payer: Self-pay | Admitting: Neurology

## 2022-06-09 DIAGNOSIS — Z87891 Personal history of nicotine dependence: Secondary | ICD-10-CM

## 2022-06-09 DIAGNOSIS — R911 Solitary pulmonary nodule: Secondary | ICD-10-CM

## 2022-06-09 NOTE — Telephone Encounter (Signed)
Noted  

## 2022-06-09 NOTE — Telephone Encounter (Signed)
I have called the patient with the results of her low-dose screening CT.  This was a follow-up scan to her Mar 06, 2022 scan.  The May scan was read as a lung RADS 3, there was notation of a 7.8 mm nodule that needed follow-up in 3 months as it was spiculated.  There are 102-month follow-up scan which was done on June 06, 2022 showed that the nodule remained 7.8 mm in the average diameter, however the equivalent diameter had gone from 7.6 mm to 8 mm.  The nodule does have characteristics of spiculation.  I had Dr. Jayme Cloud reviewed the scan with me and she agreed that we should do a 66-month follow-up to reevaluate again in a shorter interval.  I have called Ms. Clowdus and explained that we will reevaluate the nodule in 3 months, which will be November 2023.  I told her we will call her to get her schedule closer to the time.  I explained that we were just being very cautious.  She verbalized understanding of the above and agreed with the plan.  She stated that she is very appreciative that we will being careful. Angelique Blonder, please place order for 31-month follow-up low-dose CT, fax results to PCP with plan for follow-up, thank you so much.

## 2022-06-09 NOTE — Telephone Encounter (Signed)
CT results faxed to PCP with follow up plan included. Order placed for 3 mth nodule f/u low dose CT.

## 2022-09-07 ENCOUNTER — Ambulatory Visit
Admission: RE | Admit: 2022-09-07 | Discharge: 2022-09-07 | Disposition: A | Discharge status: Home / Self Care | Payer: Medicare Other | Source: Ambulatory Visit | Attending: Acute Care | Admitting: Acute Care

## 2022-09-07 DIAGNOSIS — Z87891 Personal history of nicotine dependence: Secondary | ICD-10-CM | POA: Diagnosis present

## 2022-09-07 DIAGNOSIS — R911 Solitary pulmonary nodule: Secondary | ICD-10-CM | POA: Insufficient documentation

## 2022-09-25 ENCOUNTER — Other Ambulatory Visit: Payer: Self-pay | Admitting: Acute Care

## 2022-09-25 DIAGNOSIS — Z122 Encounter for screening for malignant neoplasm of respiratory organs: Secondary | ICD-10-CM

## 2022-09-25 DIAGNOSIS — Z87891 Personal history of nicotine dependence: Secondary | ICD-10-CM

## 2023-06-15 ENCOUNTER — Encounter: Payer: Self-pay | Admitting: *Deleted

## 2023-06-22 ENCOUNTER — Encounter: Payer: Self-pay | Admitting: *Deleted

## 2023-06-22 ENCOUNTER — Ambulatory Visit: Payer: Medicare Other

## 2023-06-22 ENCOUNTER — Ambulatory Visit
Admission: RE | Admit: 2023-06-22 | Discharge: 2023-06-22 | Disposition: A | Discharge status: Home / Self Care | Payer: Medicare Other | Attending: Gastroenterology | Admitting: Gastroenterology

## 2023-06-22 ENCOUNTER — Encounter
Admission: RE | Disposition: A | Discharge status: Home / Self Care | Payer: Self-pay | Source: Home / Self Care | Attending: Gastroenterology

## 2023-06-22 DIAGNOSIS — Z8719 Personal history of other diseases of the digestive system: Secondary | ICD-10-CM | POA: Insufficient documentation

## 2023-06-22 DIAGNOSIS — K2270 Barrett's esophagus without dysplasia: Secondary | ICD-10-CM | POA: Insufficient documentation

## 2023-06-22 DIAGNOSIS — K219 Gastro-esophageal reflux disease without esophagitis: Secondary | ICD-10-CM | POA: Insufficient documentation

## 2023-06-22 DIAGNOSIS — K449 Diaphragmatic hernia without obstruction or gangrene: Secondary | ICD-10-CM | POA: Diagnosis not present

## 2023-06-22 DIAGNOSIS — Z87891 Personal history of nicotine dependence: Secondary | ICD-10-CM | POA: Diagnosis not present

## 2023-06-22 DIAGNOSIS — Z09 Encounter for follow-up examination after completed treatment for conditions other than malignant neoplasm: Secondary | ICD-10-CM | POA: Diagnosis present

## 2023-06-22 HISTORY — PX: ESOPHAGOGASTRODUODENOSCOPY (EGD) WITH PROPOFOL: SHX5813

## 2023-06-22 HISTORY — PX: BIOPSY: SHX5522

## 2023-06-22 SURGERY — ESOPHAGOGASTRODUODENOSCOPY (EGD) WITH PROPOFOL
Anesthesia: General

## 2023-06-22 MED ORDER — SODIUM CHLORIDE 0.9 % IV SOLN: INTRAVENOUS | Status: DC

## 2023-06-22 MED ORDER — LIDOCAINE HCL (PF) 2 % IJ SOLN
INTRAMUSCULAR | Status: AC
  Filled 2023-06-22: qty 5

## 2023-06-22 MED ORDER — FENTANYL CITRATE (PF) 100 MCG/2ML IJ SOLN
INTRAMUSCULAR | Status: DC | PRN
  Administered 2023-06-22: 25 ug via INTRAVENOUS
  Administered 2023-06-22: 50 ug via INTRAVENOUS
  Administered 2023-06-22: 25 ug via INTRAVENOUS

## 2023-06-22 MED ORDER — PROPOFOL 10 MG/ML IV BOLUS
INTRAVENOUS | Status: AC
  Filled 2023-06-22: qty 20

## 2023-06-22 MED ORDER — LIDOCAINE HCL (CARDIAC) PF 100 MG/5ML IV SOSY
PREFILLED_SYRINGE | INTRAVENOUS | Status: DC | PRN
  Administered 2023-06-22: 100 mg via INTRAVENOUS

## 2023-06-22 MED ORDER — GLYCOPYRROLATE 0.2 MG/ML IJ SOLN
INTRAMUSCULAR | Status: AC
  Filled 2023-06-22: qty 1

## 2023-06-22 MED ORDER — PROPOFOL 10 MG/ML IV BOLUS
INTRAVENOUS | Status: DC | PRN
  Administered 2023-06-22: 30 mg via INTRAVENOUS
  Administered 2023-06-22: 50 mg via INTRAVENOUS

## 2023-06-22 MED ORDER — GLYCOPYRROLATE 0.2 MG/ML IJ SOLN
INTRAMUSCULAR | Status: DC | PRN
  Administered 2023-06-22: .2 mg via INTRAVENOUS

## 2023-06-22 MED ORDER — FENTANYL CITRATE (PF) 100 MCG/2ML IJ SOLN
INTRAMUSCULAR | Status: AC
  Filled 2023-06-22: qty 2

## 2023-06-22 NOTE — Transfer of Care (Signed)
Immediate Anesthesia Transfer of Care Note  Patient: [name]  Procedure(s) Performed: ESOPHAGOGASTRODUODENOSCOPY (EGD) WITH PROPOFOL  Patient Location: PACU  Anesthesia Type:MAC  Level of Consciousness: drowsy  Airway & Oxygen Therapy: Patient Spontanous Breathing  Post-op Assessment: Report given to RN and Post -op Vital signs reviewed and stable  Post vital signs: Reviewed and stable  Last Vitals:  Vitals Value Taken Time  BP 124/62 06/22/23 1117  Temp    Pulse 49 06/22/23 1117  Resp 12 06/22/23 1117  SpO2 98 % 06/22/23 1117    Last Pain:  Vitals:   06/22/23 1117  TempSrc:   PainSc: Asleep         Complications: No notable events documented.

## 2023-06-22 NOTE — Op Note (Signed)
[facility]enter[facility] Patient Name: [name] Procedure Date: 06/22/2023 11:02 AM MRN: [id number] Account #: [id number] Date of Birth: 10/04/1951 Admit Type: Outpatient Age: 72 Room: [facility] ENDO ROOM 3 Gender: Female Note Status: Finalized Instrument Name: Upper Endoscope [id number] Procedure:             Upper GI endoscopy Indications:           Surveillance for malignancy due to personal history of                         Barrett's esophagus Providers:             Eather Colas MD, MD Referring MD:          Rhona Leavens. Burnett Sheng, MD (Referring MD) Medicines:             Monitored Anesthesia Care Complications:         No immediate complications. Estimated blood loss:                         Minimal. Procedure:             Pre-Anesthesia Assessment:                        - Prior to the procedure, a History and Physical was                         performed, and patient medications and allergies were                         reviewed. The patient is competent. The risks and                         benefits of the procedure and the sedation options and                         risks were discussed with the patient. All questions                         were answered and informed consent was obtained.                         Patient identification and proposed procedure were                         verified by the physician, the nurse, the                         anesthesiologist, the anesthetist and the technician                         in the endoscopy suite. Mental Status Examination:                         alert and oriented. Airway Examination: normal                         oropharyngeal airway and neck mobility. Respiratory  Examination: clear to auscultation. CV Examination:                         normal. Prophylactic Antibiotics: The patient does not                         require prophylactic antibiotics. Prior                          Anticoagulants: The patient has taken no anticoagulant                         or antiplatelet agents. ASA Grade Assessment: II - A                         patient with mild systemic disease. After reviewing                         the risks and benefits, the patient was deemed in                         satisfactory condition to undergo the procedure. The                         anesthesia plan was to use monitored anesthesia care                         (MAC). Immediately prior to administration of                         medications, the patient was re-assessed for adequacy                         to receive sedatives. The heart rate, respiratory                         rate, oxygen saturations, blood pressure, adequacy of                         pulmonary ventilation, and response to care were                         monitored throughout the procedure. The physical                         status of the patient was re-assessed after the                         procedure.                        After obtaining informed consent, the endoscope was                         passed under direct vision. Throughout the procedure,                         the patient's blood pressure, pulse, and oxygen  saturations were monitored continuously. The Endoscope                         was introduced through the mouth, and advanced to the                         second part of duodenum. The upper GI endoscopy was                         accomplished without difficulty. The patient tolerated                         the procedure well. Findings:      One tongue of salmon-colored mucosa was present. Squamous islands were       present. The maximum longitudinal extent of these esophageal mucosal       changes was 1 cm in length. Biopsies were taken with a cold forceps for       histology. Estimated blood loss was minimal.      A small hiatal hernia was present.      The  entire examined stomach was normal.      The examined duodenum was normal. Impression:            - Salmon-colored mucosa classified as Barrett's stage                         C1-M1 per [place] criteria. Biopsied.                        - Small hiatal hernia.                        - Normal stomach.                        - Normal examined duodenum. Recommendation:        - Discharge patient to home.                        - Resume previous diet.                        - Continue present medications.                        - Await pathology results.                        - Return to referring physician as previously                         scheduled. Procedure Code(s):     --- Professional ---                        [phone], Esophagogastroduodenoscopy, flexible,                         transoral; with biopsy, single or multiple Diagnosis Code(s):     --- Professional ---                        K22.70,  Barrett's esophagus without dysplasia                        K44.9, Diaphragmatic hernia without obstruction or                         gangrene CPT copyright 2022 American Medical Association. All rights reserved. The codes documented in this report are preliminary and upon coder review may  be revised to meet current compliance requirements. Eather Colas MD, MD 06/22/2023 11:18:51 AM Number of Addenda: 0 Note Initiated On: 06/22/2023 11:02 AM Estimated Blood Loss:  Estimated blood loss was minimal.      [facility]

## 2023-06-22 NOTE — H&P (Signed)
Outpatient short stay form Pre-procedure 06/22/2023  Regis Bill, MD  Primary Physician: Jerl Mina, MD  Reason for visit:  Barrett's esophagus  History of present illness:    72 y/o lady with reported history of BE here for EGD for reported history of BE. 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 No blood thinners. No family history of GI malignancies. No neck surgeries.   Current Facility-Administered Medications:    0.9 %  sodium chloride infusion, , Intravenous, Continuous, Kahmari Herard, Rossie Muskrat, MD, Last Rate: 20 mL/hr at 06/22/23 1042, New Bag at 06/22/23 1042  Medications Prior to Admission  Medication Sig Dispense Refill Last Dose   aspirin EC 81 MG tablet Take 81 mg by mouth daily. Swallow whole.   06/22/2023   Multiple Vitamins-Minerals (MULTIVITAMIN WOMEN) TABS Take by mouth.   06/22/2023   omeprazole (PRILOSEC) 40 MG capsule Take by mouth.   06/22/2023   acetaminophen (TYLENOL) 500 MG tablet Take by mouth.      estradiol (VIVELLE-DOT) 0.075 MG/24HR Place onto the skin.      Melatonin 5 MG TABS Take by mouth.        No Known Allergies   Past Medical History:  Diagnosis Date   Barrett's esophagus    Chest pain, unspecified    GERD (gastroesophageal reflux disease)    Transexualism     Review of systems:  Otherwise negative.    Physical Exam  Gen: Alert, oriented. Appears stated age.  HEENT: PERRLA. Lungs: No respiratory distress CV: RRR Abd: soft, benign, no masses Ext: No edema    Planned procedures: Proceed with EGD. The patient understands the nature of the planned procedure, indications, risks, alternatives and potential complications including but not limited to bleeding, infection, perforation, damage to internal organs and possible oversedation/side effects from anesthesia. The patient agrees and gives consent to proceed.  Please refer to procedure notes for findings, recommendations and patient disposition/instructions.     Regis Bill, MD [facility]enter[facility]

## 2023-06-22 NOTE — Anesthesia Preprocedure Evaluation (Signed)
Anesthesia Evaluation  Patient identified by MRN, date of birth, ID band Patient awake    Reviewed: Allergy & Precautions, H&P , NPO status , Patient's Chart, lab work & pertinent test results, reviewed documented beta blocker date and time   History of Anesthesia Complications Negative for: history of anesthetic complications  Airway Mallampati: II  TM Distance: >3 FB Neck ROM: full    Dental  (+) Dental Advidsory Given, Upper Dentures, Lower Dentures   Pulmonary neg pulmonary ROS, Continuous Positive Airway Pressure Ventilation , former smoker   Pulmonary exam normal breath sounds clear to auscultation       Cardiovascular Exercise Tolerance: Good (-) hypertension(-) angina + CAD and + Cardiac Stents (15 years ago)  (-) Past MI Normal cardiovascular exam(-) dysrhythmias (-) Valvular Problems/Murmurs Rhythm:regular Rate:Normal     Neuro/Psych negative neurological ROS  negative psych ROS   GI/Hepatic Neg liver ROS,GERD  ,,  Endo/Other  negative endocrine ROS    Renal/GU negative Renal ROS  negative genitourinary   Musculoskeletal   Abdominal   Peds  Hematology negative hematology ROS (+)   Anesthesia Other Findings Past Medical History: No date: Barrett's esophagus No date: Chest pain, unspecified No date: GERD (gastroesophageal reflux disease) No date: Transexualism   Reproductive/Obstetrics negative OB ROS                             Anesthesia Physical Anesthesia Plan  ASA: 2  Anesthesia Plan: General   Post-op Pain Management:    Induction: Intravenous  PONV Risk Score and Plan: 2 and Propofol infusion and TIVA  Airway Management Planned: Natural Airway and Nasal Cannula  Additional Equipment:   Intra-op Plan:   Post-operative Plan:   Informed Consent: I have reviewed the patients History and Physical, chart, labs and discussed the procedure including the risks,  benefits and alternatives for the proposed anesthesia with the patient or authorized representative who has indicated his/her understanding and acceptance.     Dental Advisory Given  Plan Discussed with: Anesthesiologist, CRNA and Surgeon  Anesthesia Plan Comments:        Anesthesia Quick Evaluation

## 2023-06-22 NOTE — Interval H&P Note (Signed)
History and Physical Interval Note:  06/22/2023 10:57 AM  [name]  has presented today for surgery, with the diagnosis of BARRETT'S  ESOPHAGUS.  The various methods of treatment have been discussed with the patient and family. After consideration of risks, benefits and other options for treatment, the patient has consented to  Procedure(s): ESOPHAGOGASTRODUODENOSCOPY (EGD) WITH PROPOFOL (N/A) as a surgical intervention.  The patient's history has been reviewed, patient examined, no change in status, stable for surgery.  I have reviewed the patient's chart and labs.  Questions were answered to the patient's satisfaction.     Regis Bill  Ok to proceed with EGD

## 2023-06-26 ENCOUNTER — Encounter: Payer: Self-pay | Admitting: Gastroenterology

## 2023-06-28 NOTE — Anesthesia Postprocedure Evaluation (Signed)
Anesthesia Post Note  Patient: [name]  Procedure(s) Performed: ESOPHAGOGASTRODUODENOSCOPY (EGD) WITH PROPOFOL BIOPSY  Patient location during evaluation: Endoscopy Anesthesia Type: General Level of consciousness: awake and alert Pain management: pain level controlled Vital Signs Assessment: post-procedure vital signs reviewed and stable Respiratory status: spontaneous breathing, nonlabored ventilation, respiratory function stable and patient connected to nasal cannula oxygen Cardiovascular status: blood pressure returned to baseline and stable Postop Assessment: no apparent nausea or vomiting Anesthetic complications: no   No notable events documented.   Last Vitals:  Vitals:   06/22/23 1117 06/22/23 1129  BP: 124/62 109/72  Pulse: (!) 49 (!) 52  Resp: 12 16  Temp: (!) 35.7 C   SpO2: 98% 99%    Last Pain:  Vitals:   06/23/23 1017  TempSrc:   PainSc: 0-No pain                 Lenard Simmer

## 2023-07-04 IMAGING — CT CT CHEST LUNG CANCER SCREENING LOW DOSE W/O CM
2 of 5 series · 15 of 40 positions shown, 18 images · non-contrast
Comparison: None Available.

CLINICAL DATA: 71-year-old female with 49 pack year history of
smoking. Lung cancer screening.



[Series 3: lung 1.00 · axial · 0.65mm/px · z∈[-1182,-879]mm · 12 of 335 slices shown, 15 images]
[im 16/335  mediastinal]
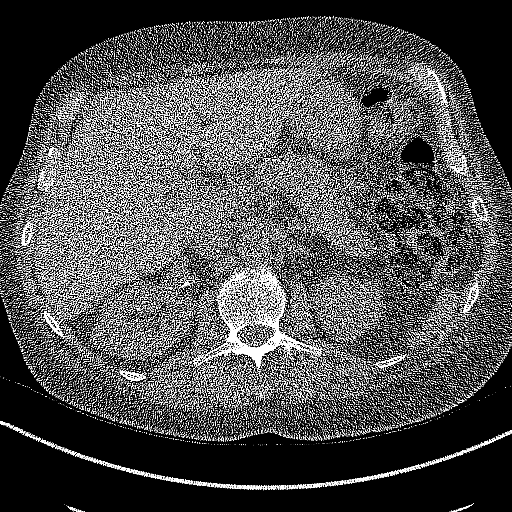
[im 16/335  lung]
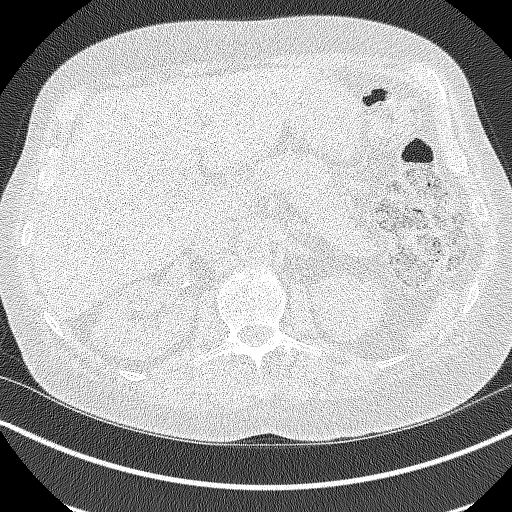
[im 48/335  lung]
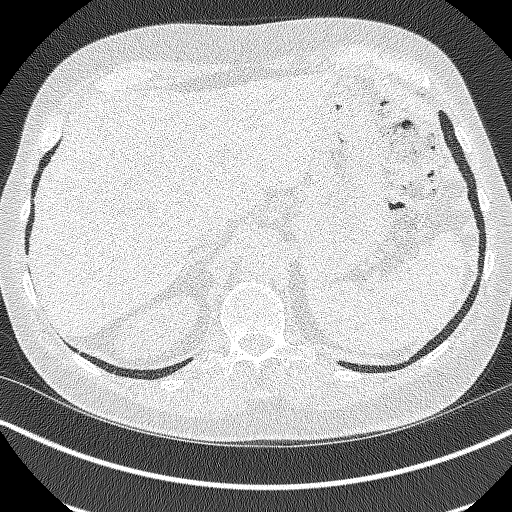
[im 80/335  lung]
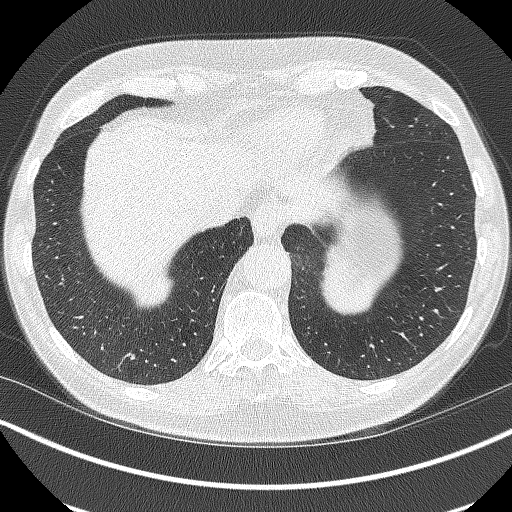
[im 96/335  lung]
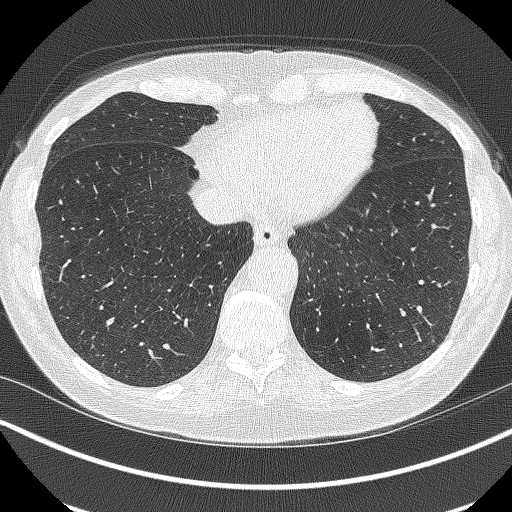
[im 128/335  mediastinal]
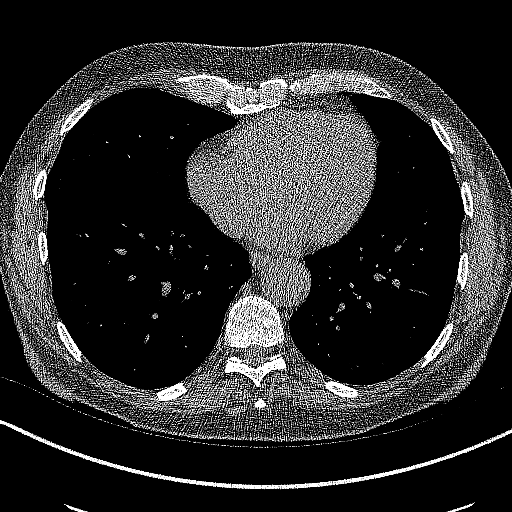
[im 128/335  lung]
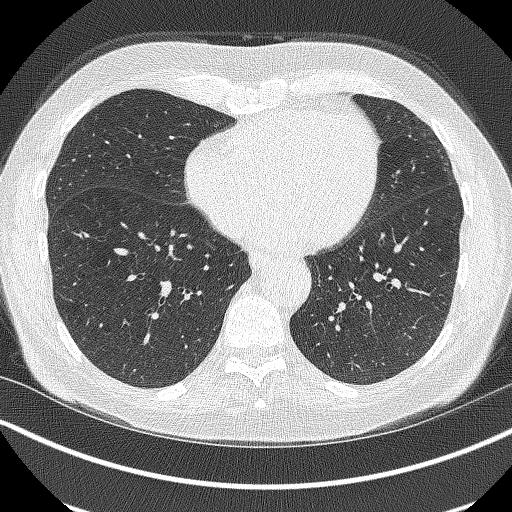
[im 160/335  lung]
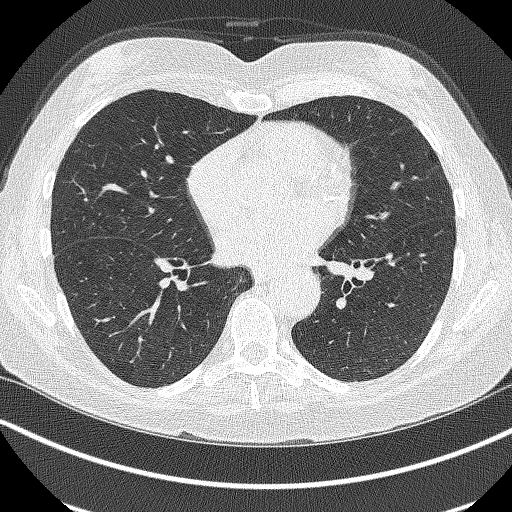
[im 175/335  lung]
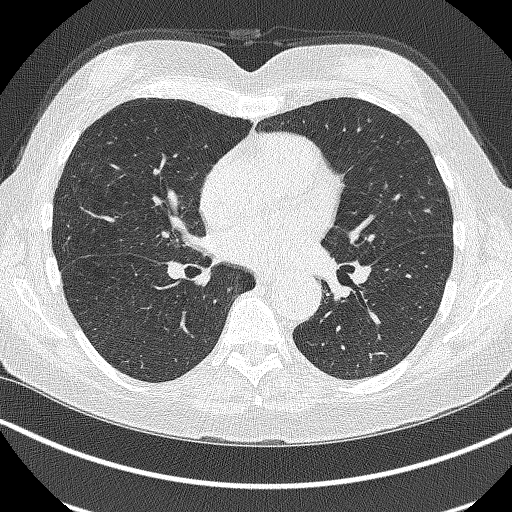
[im 207/335  lung]
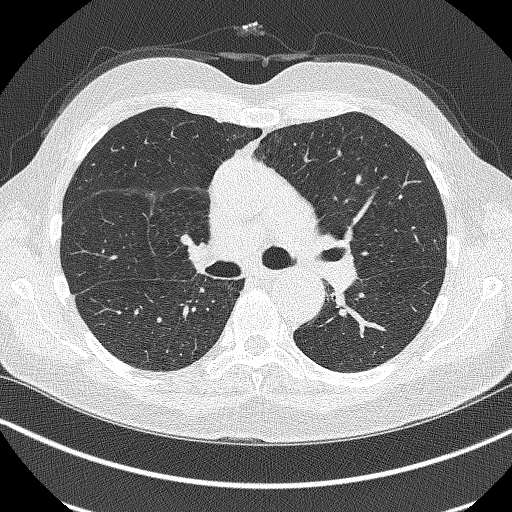
[im 239/335  mediastinal]
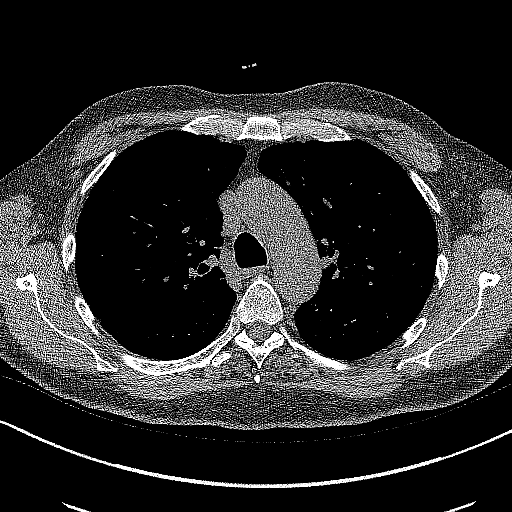
[im 239/335  lung]
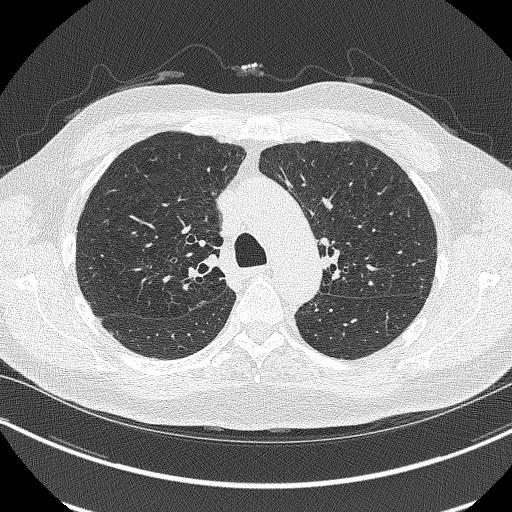
[im 255/335  lung]
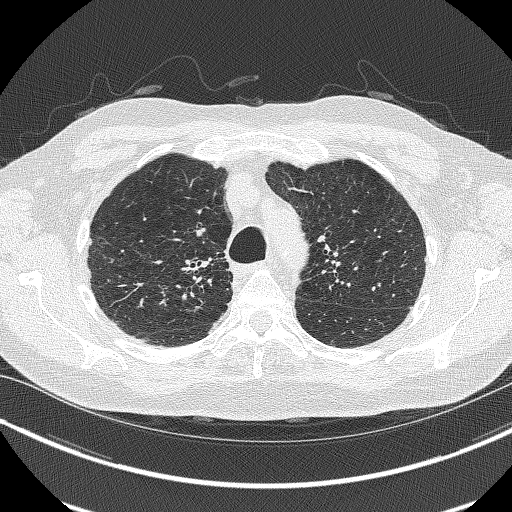
[im 287/335  lung]
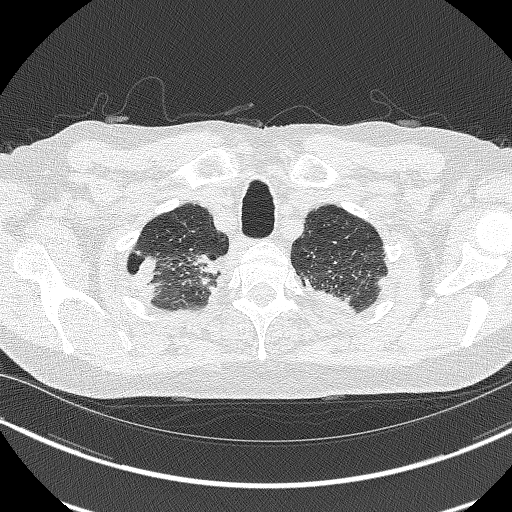
[im 319/335  lung]
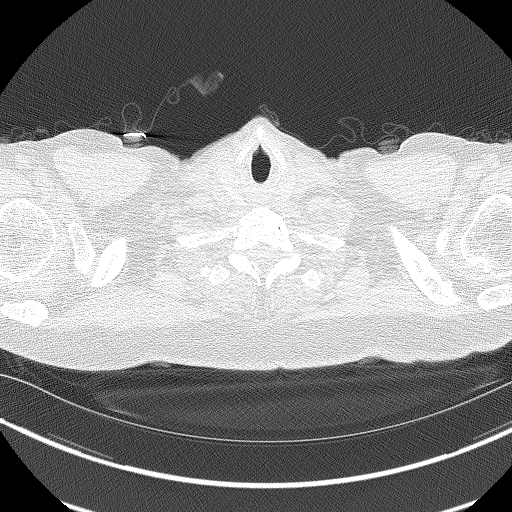

[Series 5: coronals lung 1.00 cor · coronal · 0.65mm/px · 3 of 269 slices shown]
[im 54/269  lung]
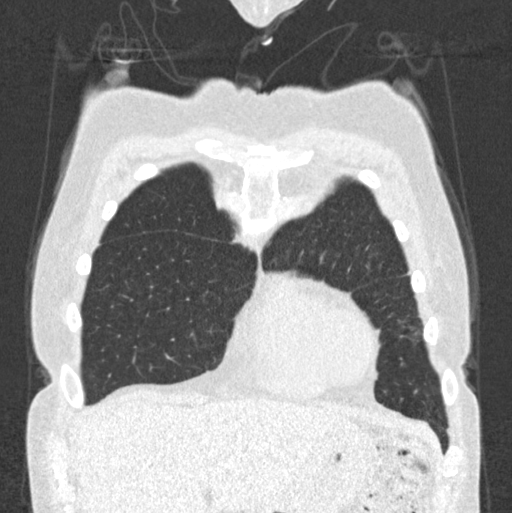
[im 108/269  lung]
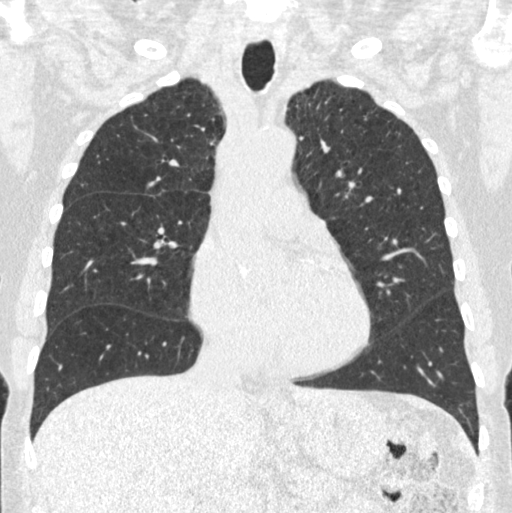
[im 161/269  lung]
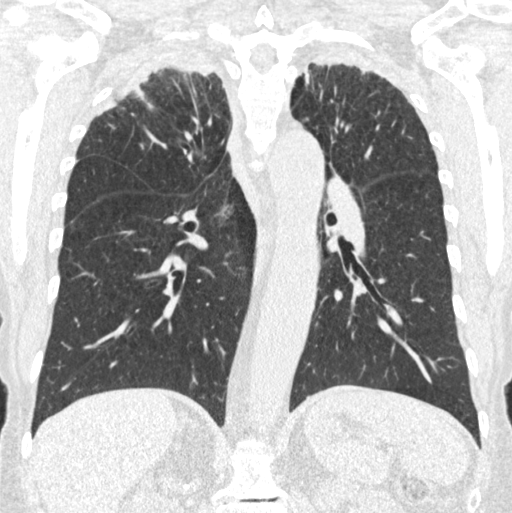

[15 of 40 positions shown; findings below may reference images not displayed]

FINDINGS: Cardiovascular: The heart size is normal. No substantial pericardial
effusion. Coronary artery calcification is evident. Mild
atherosclerotic calcification is noted in the wall of the thoracic
aorta.

Mediastinum/Nodes: No mediastinal lymphadenopathy. No evidence for
gross hilar lymphadenopathy although assessment is limited by the
lack of intravenous contrast on the current study. The esophagus has
normal imaging features. There is no axillary lymphadenopathy.

Lungs/Pleura: Symmetric biapical pleuroparenchymal scarring evident.
Marked architectural distortion in the upper lungs bilaterally.
Bilateral pulmonary nodules are identified including a spiculated
left upper lobe 7.8 mm nodule (image 69). No focal airspace
consolidation. There is no evidence of pleural effusion.

Upper Abdomen: Unremarkable.

Musculoskeletal: No worrisome lytic or sclerotic osseous
abnormality.
IMPRESSION: 1. Lung-RADS 3, probably benign findings. Short-term follow-up in 6
months is recommended with repeat low-dose chest CT without contrast
(please use the following order, "CT CHEST LCS NODULE FOLLOW-UP W/O
CM").
2.  Emphysema ([id number]-DCM.J) and Aortic Atherosclerosis (N28XK-170.0)

These results will be called to the ordering clinician or
representative by the Radiologist Assistant, and communication
documented in the PACS or [REDACTED].

## 2023-09-10 ENCOUNTER — Ambulatory Visit
Admission: RE | Admit: 2023-09-10 | Discharge: 2023-09-10 | Disposition: A | Discharge status: Home / Self Care | Payer: Medicare Other | Source: Ambulatory Visit | Attending: Acute Care | Admitting: Acute Care

## 2023-09-10 DIAGNOSIS — Z122 Encounter for screening for malignant neoplasm of respiratory organs: Secondary | ICD-10-CM | POA: Diagnosis present

## 2023-09-10 DIAGNOSIS — Z87891 Personal history of nicotine dependence: Secondary | ICD-10-CM | POA: Diagnosis present

## 2023-09-19 ENCOUNTER — Other Ambulatory Visit: Payer: Self-pay | Admitting: Medical Genetics

## 2023-09-24 ENCOUNTER — Other Ambulatory Visit
Admission: RE | Admit: 2023-09-24 | Discharge: 2023-09-24 | Disposition: A | Discharge status: Home / Self Care | Payer: Self-pay | Source: Ambulatory Visit | Attending: Medical Genetics | Admitting: Medical Genetics

## 2023-09-26 ENCOUNTER — Other Ambulatory Visit: Payer: Self-pay | Admitting: Medical Genetics

## 2023-09-28 ENCOUNTER — Other Ambulatory Visit: Payer: Medicare Other

## 2023-10-03 ENCOUNTER — Other Ambulatory Visit: Payer: Self-pay | Admitting: Acute Care

## 2023-10-03 DIAGNOSIS — Z122 Encounter for screening for malignant neoplasm of respiratory organs: Secondary | ICD-10-CM

## 2023-10-03 DIAGNOSIS — Z87891 Personal history of nicotine dependence: Secondary | ICD-10-CM

## 2023-11-06 LAB — GENECONNECT MOLECULAR SCREEN: Genetic Analysis Overall Interpretation: NEGATIVE

## 2024-09-10 ENCOUNTER — Ambulatory Visit
Admission: RE | Admit: 2024-09-10 | Discharge: 2024-09-10 | Disposition: A | Discharge status: Home / Self Care | Source: Ambulatory Visit | Attending: Acute Care | Admitting: Acute Care

## 2024-09-10 DIAGNOSIS — Z87891 Personal history of nicotine dependence: Secondary | ICD-10-CM | POA: Insufficient documentation

## 2024-09-10 DIAGNOSIS — Z122 Encounter for screening for malignant neoplasm of respiratory organs: Secondary | ICD-10-CM | POA: Diagnosis present

## 2024-09-17 ENCOUNTER — Other Ambulatory Visit: Payer: Self-pay

## 2024-09-17 DIAGNOSIS — Z87891 Personal history of nicotine dependence: Secondary | ICD-10-CM

## 2024-09-17 DIAGNOSIS — Z122 Encounter for screening for malignant neoplasm of respiratory organs: Secondary | ICD-10-CM
